# Patient Record
Sex: Female | Born: 1990 | Race: White | Hispanic: No | Marital: Single | Smoking: Never smoker
Health system: Southern US, Community
[De-identification: ages and names within clinical notes are randomized; demographics above are authoritative.]

## PROBLEM LIST (undated history)

## (undated) DIAGNOSIS — I10 Essential (primary) hypertension: Secondary | ICD-10-CM

## (undated) HISTORY — DX: Essential (primary) hypertension: I10

---
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
## 2013-01-28 ENCOUNTER — Ambulatory Visit: Payer: 59 | Admitting: Physician Assistant

## 2013-01-28 VITALS — BP 98/60 | HR 89 | Temp 98.3°F | Resp 18 | Ht 66.0 in | Wt 130.0 lb

## 2013-01-28 DIAGNOSIS — J329 Chronic sinusitis, unspecified: Secondary | ICD-10-CM

## 2013-01-28 DIAGNOSIS — R0981 Nasal congestion: Secondary | ICD-10-CM

## 2013-01-28 DIAGNOSIS — J3489 Other specified disorders of nose and nasal sinuses: Secondary | ICD-10-CM

## 2013-01-28 MED ORDER — AMOXICILLIN 875 MG PO TABS: 875.0000 mg | ORAL_TABLET | Freq: Two times a day (BID) | ORAL | Status: DC

## 2013-01-28 MED ORDER — IPRATROPIUM BROMIDE 0.03 % NA SOLN: 2.0000 | Freq: Two times a day (BID) | NASAL | Status: DC

## 2013-01-28 NOTE — Progress Notes (Signed)
   Subjective:    Patient ID: Maria Frazier, female    DOB: 04-19-1990, 22 y.o.   MRN: [id number]  HPI 22 year old female presents for evaluation of sore throat, nasal congestion, cough, and bilateral ear pressure.  States symptoms started 3 days ago and have progressively worsened.  Has hx of sinus infections in the past.  Denies fever, chills, nausea, vomiting, sinus pain, SOB, wheezing, headache, or dizziness.  She has been taking Mucinex and vitamin C which have not been helping much.   Patient is otherwise healthy with no other concerns today.  She works as a Theatre stage manager at [employer].     Review of Systems  Constitutional: Negative for fever and chills.  HENT: Positive for postnasal drip, rhinorrhea, sinus pressure and sore throat.   Respiratory: Negative for wheezing.   Gastrointestinal: Negative for nausea.  Neurological: Negative for dizziness.       Objective:   Physical Exam  Constitutional: She is oriented to person, place, and time. She appears well-developed and well-nourished.  HENT:  Head: Normocephalic and atraumatic.  Right Ear: Hearing and external ear normal.  Left Ear: Hearing, tympanic membrane, external ear and ear canal normal.  Mouth/Throat: Uvula is midline. Oropharyngeal exudate and posterior oropharyngeal erythema (2+ tonsillar swelling) present.  Right ear cerumen impaction. Normal TM s/p irrigation  Eyes: Conjunctivae are normal.  Neck: Normal range of motion. Neck supple.  Cardiovascular: Normal rate, regular rhythm and normal heart sounds.   Pulmonary/Chest: Effort normal and breath sounds normal.  Lymphadenopathy:    She has no cervical adenopathy.  Neurological: She is alert and oriented to person, place, and time.  Psychiatric: She has a normal mood and affect. Her behavior is normal. Judgment and thought content normal.     Patient reports improvement in ear fullness after irrigation.      Assessment & Plan:  Sinusitis - Plan: amoxicillin (AMOXIL) 875  MG tablet  Nasal congestion - Plan: ipratropium (ATROVENT) 0.03 % nasal spray  Will go ahead and treat with amoxicillin 875 mg bid x 10 days Atrovent NS twice daily to help with sinus pressure and PND Continue Mucinex as directed Increase fluids and rest Note for work for yesterday and tomorrow Follow up if symptoms worsen or fail to improve.

## 2018-08-27 DIAGNOSIS — Z20828 Contact with and (suspected) exposure to other viral communicable diseases: Secondary | ICD-10-CM | POA: Diagnosis not present

## 2019-04-23 ENCOUNTER — Other Ambulatory Visit: Payer: Self-pay

## 2019-04-26 ENCOUNTER — Encounter: Payer: Self-pay | Admitting: Obstetrics and Gynecology

## 2019-04-26 ENCOUNTER — Ambulatory Visit (INDEPENDENT_AMBULATORY_CARE_PROVIDER_SITE_OTHER): Payer: BC Managed Care – PPO | Admitting: Obstetrics and Gynecology

## 2019-04-26 ENCOUNTER — Other Ambulatory Visit: Payer: Self-pay

## 2019-04-26 ENCOUNTER — Other Ambulatory Visit (HOSPITAL_COMMUNITY)
Admission: RE | Admit: 2019-04-26 | Discharge: 2019-04-26 | Disposition: A | Discharge status: Home / Self Care | Payer: BC Managed Care – PPO | Source: Ambulatory Visit | Attending: Obstetrics and Gynecology | Admitting: Obstetrics and Gynecology

## 2019-04-26 VITALS — BP 122/60 | HR 81 | Temp 98.7°F | Ht 64.75 in | Wt 164.0 lb

## 2019-04-26 DIAGNOSIS — Z113 Encounter for screening for infections with a predominantly sexual mode of transmission: Secondary | ICD-10-CM

## 2019-04-26 DIAGNOSIS — Z124 Encounter for screening for malignant neoplasm of cervix: Secondary | ICD-10-CM | POA: Insufficient documentation

## 2019-04-26 DIAGNOSIS — Z01419 Encounter for gynecological examination (general) (routine) without abnormal findings: Secondary | ICD-10-CM | POA: Diagnosis not present

## 2019-04-26 DIAGNOSIS — Z0000 Encounter for general adult medical examination without abnormal findings: Secondary | ICD-10-CM | POA: Diagnosis not present

## 2019-04-26 DIAGNOSIS — Z3009 Encounter for other general counseling and advice on contraception: Secondary | ICD-10-CM

## 2019-04-26 DIAGNOSIS — N946 Dysmenorrhea, unspecified: Secondary | ICD-10-CM | POA: Diagnosis not present

## 2019-04-26 MED ORDER — NORETHIN ACE-ETH ESTRAD-FE 1-20 MG-MCG PO TABS: 1.0000 | ORAL_TABLET | Freq: Every day | ORAL | 0 refills | Status: DC

## 2019-04-26 NOTE — Progress Notes (Signed)
29 y.o. G0P0000 Single White or Caucasian Not Hispanic or Latino female here for annual exam and std testing. She does not have any symptoms.  Period Cycle (Days): 30 Period Duration (Days): 4 Period Pattern: Regular Menstrual Flow: Moderate, Light Menstrual Control: Panty liner, Tampon Menstrual Control Change Freq (Hours): 2 Dysmenorrhea: (!) Severe(bad cramps on the first day and Nausea on the first day.) Dysmenorrhea Symptoms: Cramping, Nausea  She uses regular tampons, could wear a tampon for 4-5 hours, changes them when she goes to the bathroom. Cramps have been severe since her mid teens. Not worsening. Only bad for one day. Midol helps, doesn't miss work, tolerable.  Sexually active, same partner x 2 years. Using W/D for contraception and rhythm.   Patient's last menstrual period was 04/17/2019.          Sexually active: Yes.    The current method of family planning is none.    Exercising: Yes.    Walking Smoker:  no  Health Maintenance: Pap:  Never  History of abnormal Pap:  NA TDaP:  2012 Gardasil: never, information given.    reports that she has never smoked. She has never used smokeless tobacco. She reports current alcohol use. She reports that she does not use drugs. Social drinker, up to 4 drinks a week. 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   Past Medical History:  Diagnosis Date  . Hypertension    white coat     History reviewed. No pertinent surgical history.  No current outpatient medications on file.   No current facility-administered medications for this visit.    Family History  Problem Relation Age of Onset  . Thyroid disease Mother   . Asthma Father   . Hyperlipidemia Father   . Stroke Father     Review of Systems  Constitutional: Negative.   HENT: Negative.   Eyes: Negative.   Respiratory: Negative.   Cardiovascular: Negative.   Gastrointestinal: Negative.   Endocrine: Negative.   Genitourinary: Negative.    Musculoskeletal: Negative.   Skin: Negative.   Allergic/Immunologic: Negative.   Neurological: Negative.   Hematological: Negative.   Psychiatric/Behavioral: Negative.   All other systems reviewed and are negative.   Exam:   BP 122/60   Pulse 81   Temp 98.7 F (37.1 C)   Ht 5' 4.75" (1.645 m)   Wt 164 lb (74.4 kg)   LMP 04/17/2019   SpO2 98%   BMI 27.50 kg/m   Weight change: @WEIGHTCHANGE @ Height:   Height: 5' 4.75" (164.5 cm)  Ht Readings from Last 3 Encounters:  04/26/19 5' 4.75" (1.645 m)  01/28/13 5\' 6"  (1.676 m)    General appearance: alert, cooperative and appears stated age Head: Normocephalic, without obvious abnormality, atraumatic Neck: no adenopathy, supple, symmetrical, trachea midline and thyroid normal to inspection and palpation Lungs: clear to auscultation bilaterally Cardiovascular: regular rate and rhythm Breasts: normal appearance, no masses or tenderness Abdomen: soft, non-tender; non distended,  no masses,  no organomegaly Extremities: extremities normal, atraumatic, no cyanosis or edema Skin: Skin color, texture, turgor normal. No rashes or lesions Lymph nodes: Cervical, supraclavicular, and axillary nodes normal. No abnormal inguinal nodes palpated Neurologic: Grossly normal   Pelvic: External genitalia:  no lesions              Urethra:  normal appearing urethra with no masses, tenderness or lesions              Bartholins and Skenes: normal  Vagina: normal appearing vagina with normal color and discharge, no lesions              Cervix: no lesions               Bimanual Exam:  Uterus:  normal size, contour, position, consistency, mobility, non-tender              Adnexa: no mass, fullness, tenderness               Rectovaginal: declines  Gae Dry chaperoned for the exam.  A:  Well Woman with normal exam  Contraception counseling     P:   Pap with reflex hpv, STD testing  Screening labs/std testing  Discussed breast  self exam  Discussed calcium and vit D intake  Start OCP's, no contraindications, risks reviewed  Pill check in 3 month

## 2019-04-26 NOTE — Patient Instructions (Addendum)
EXERCISE AND DIET:  We recommended that you start or continue a regular exercise program for good health. Regular exercise means any activity that makes your heart beat faster and makes you sweat.  We recommend exercising at least 30 minutes per day at least 3 days a week, preferably 4 or 5.  We also recommend a diet low in fat and sugar.  Inactivity, poor dietary choices and obesity can cause diabetes, heart attack, stroke, and kidney damage, among others.    ALCOHOL AND SMOKING:  Women should limit their alcohol intake to no more than 7 drinks/beers/glasses of wine (combined, not each!) per week. Moderation of alcohol intake to this level decreases your risk of breast cancer and liver damage. And of course, no recreational drugs are part of a healthy lifestyle.  And absolutely no smoking or even second hand smoke. Most people know smoking can cause heart and lung diseases, but did you know it also contributes to weakening of your bones? Aging of your skin?  Yellowing of your teeth and nails?  CALCIUM AND VITAMIN D:  Adequate intake of calcium and Vitamin D are recommended.  The recommendations for exact amounts of these supplements seem to change often, but generally speaking 1,000 mg of calcium (between diet and supplement) and 800 units of Vitamin D per day seems prudent. Certain women may benefit from higher intake of Vitamin D.  If you are among these women, your doctor will have told you during your visit.    PAP SMEARS:  Pap smears, to check for cervical cancer or precancers,  have traditionally been done yearly, although recent scientific advances have shown that most women can have pap smears less often.  However, every woman still should have a physical exam from her gynecologist every year. It will include a breast check, inspection of the vulva and vagina to check for abnormal growths or skin changes, a visual exam of the cervix, and then an exam to evaluate the size and shape of the uterus and  ovaries.  And after 27 years of age, a rectal exam is indicated to check for rectal cancers. We will also provide age appropriate advice regarding health maintenance, like when you should have certain vaccines, screening for sexually transmitted diseases, bone density testing, colonoscopy, mammograms, etc.   MAMMOGRAMS:  All women over 40 years old should have a yearly mammogram. Many facilities now offer a "3D" mammogram, which may cost around $50 extra out of pocket. If possible,  we recommend you accept the option to have the 3D mammogram performed.  It both reduces the number of women who will be called back for extra views which then turn out to be normal, and it is better than the routine mammogram at detecting truly abnormal areas.    COLON CANCER SCREENING: Now recommend starting at age 45. At this time colonoscopy is not covered for routine screening until 50. There are take home tests that can be done between 45-49.   COLONOSCOPY:  Colonoscopy to screen for colon cancer is recommended for all women at age 50.  We know, you hate the idea of the prep.  We agree, BUT, having colon cancer and not knowing it is worse!!  Colon cancer so often starts as a polyp that can be seen and removed at colonscopy, which can quite literally save your life!  And if your first colonoscopy is normal and you have no family history of colon cancer, most women don't have to have it again for   10 years.  Once every ten years, you can do something that may end up saving your life, right?  We will be happy to help you get it scheduled when you are ready.  Be sure to check your insurance coverage so you understand how much it will cost.  It may be covered as a preventative service at no cost, but you should check your particular policy.      Breast Self-Awareness Breast self-awareness means being familiar with how your breasts look and feel. It involves checking your breasts regularly and reporting any changes to your  health care provider. Practicing breast self-awareness is important. A change in your breasts can be a sign of a serious medical problem. Being familiar with how your breasts look and feel allows you to find any problems early, when treatment is more likely to be successful. All women should practice breast self-awareness, including women who have had breast implants. How to do a breast self-exam One way to learn what is normal for your breasts and whether your breasts are changing is to do a breast self-exam. To do a breast self-exam: Look for Changes  1. Remove all the clothing above your waist. 2. Stand in front of a mirror in a room with good lighting. 3. Put your hands on your hips. 4. Push your hands firmly downward. 5. Compare your breasts in the mirror. Look for differences between them (asymmetry), such as: ? Differences in shape. ? Differences in size. ? Puckers, dips, and bumps in one breast and not the other. 6. Look at each breast for changes in your skin, such as: ? Redness. ? Scaly areas. 7. Look for changes in your nipples, such as: ? Discharge. ? Bleeding. ? Dimpling. ? Redness. ? A change in position. Feel for Changes Carefully feel your breasts for lumps and changes. It is best to do this while lying on your back on the floor and again while sitting or standing in the shower or tub with soapy water on your skin. Feel each breast in the following way:  Place the arm on the side of the breast you are examining above your head.  Feel your breast with the other hand.  Start in the nipple area and make  inch (2 cm) overlapping circles to feel your breast. Use the pads of your three middle fingers to do this. Apply light pressure, then medium pressure, then firm pressure. The light pressure will allow you to feel the tissue closest to the skin. The medium pressure will allow you to feel the tissue that is a little deeper. The firm pressure will allow you to feel the tissue  close to the ribs.  Continue the overlapping circles, moving downward over the breast until you feel your ribs below your breast.  Move one finger-width toward the center of the body. Continue to use the  inch (2 cm) overlapping circles to feel your breast as you move slowly up toward your collarbone.  Continue the up and down exam using all three pressures until you reach your armpit.  Write Down What You Find  Write down what is normal for each breast and any changes that you find. Keep a written record with breast changes or normal findings for each breast. By writing this information down, you do not need to depend only on memory for size, tenderness, or location. Write down where you are in your menstrual cycle, if you are still menstruating. If you are having trouble noticing differences   in your breasts, do not get discouraged. With time you will become more familiar with the variations in your breasts and more comfortable with the exam. How often should I examine my breasts? Examine your breasts every month. If you are breastfeeding, the best time to examine your breasts is after a feeding or after using a breast pump. If you menstruate, the best time to examine your breasts is 5-7 days after your period is over. During your period, your breasts are lumpier, and it may be more difficult to notice changes. When should I see my health care provider? See your health care provider if you notice:  A change in shape or size of your breasts or nipples.  A change in the skin of your breast or nipples, such as a reddened or scaly area.  Unusual discharge from your nipples.  A lump or thick area that was not there before.  Pain in your breasts.  Anything that concerns you.  Oral Contraception Information Oral contraceptive pills (OCPs) are medicines taken to prevent pregnancy. OCPs are taken by mouth, and they work by:  Preventing the ovaries from releasing eggs.  Thickening mucus in  the lower part of the uterus (cervix), which prevents sperm from entering the uterus.  Thinning the lining of the uterus (endometrium), which prevents a fertilized egg from attaching to the endometrium. OCPs are highly effective when taken exactly as prescribed. However, OCPs do not prevent STIs (sexually transmitted infections). Safe sex practices, such as using condoms while on an OCP, can help prevent STIs. Before starting OCPs Before you start taking OCPs, you may have a physical exam, blood test, and Pap test. However, you are not required to have a pelvic exam in order to be prescribed OCPs. Your health care provider will make sure you are a good candidate for oral contraception. OCPs are not a good option for certain women, including women who smoke and are older than 35 years, and women with a medical history of high blood pressure, deep vein thrombosis, pulmonary embolism, stroke, cardiovascular disease, or peripheral vascular disease. Discuss with your health care provider the possible side effects of the OCP you may be prescribed. When you start an OCP, be aware that it can take 2-3 months for your body to adjust to changes in hormone levels. Follow instructions from your health care provider about how to start taking your first cycle of OCPs. Depending on when you start the pill, you may need to use a backup form of birth control, such as condoms, during the first week. Make sure you know what steps to take if you ever forget to take the pill. Types of oral contraception  The most common types of birth control pills contain the hormones estrogen and progestin (synthetic progesterone) or progestin only. The combination pill This type of pill contains estrogen and progestin hormones. Combination pills often come in packs of 21, 28, or 91 pills. For each pack, the last 7 pills may not contain hormones, which means you may stop taking the pills for 7 days. Menstrual bleeding occurs during the  week that you do not take the pills or that you take the pills with no hormones in them. The minipill This type of pill contains the progestin hormone only. It comes in packs of 28 pills. All 28 pills contain the hormone. You take the pill every day. It is very important to take the pill at the same time each day. Advantages of oral contraceptive pills    Provides reliable and continuous contraception if taken as instructed.  May treat or decrease symptoms of: ? Menstrual period cramps. ? Irregular menstrual cycle or bleeding. ? Heavy menstrual flow. ? Abnormal uterine bleeding. ? Acne, depending on the type of pill. ? Polycystic ovarian syndrome. ? Endometriosis. ? Iron deficiency anemia. ? Premenstrual symptoms, including premenstrual dysphoric disorder.  May reduce the risk of endometrial and ovarian cancer.  Can be used as emergency contraception.  Prevents mislocated (ectopic) pregnancies and infections of the fallopian tubes. Things that can make oral contraceptive pills less effective OCPs can be less effective if:  You forget to take the pill at the same time every day. This is especially important when taking the minipill.  You have a stomach or intestinal disease that reduces your body's ability to absorb the pill.  You take OCPs with other medicines that make OCPs less effective, such as antibiotics, certain HIV medicines, and some seizure medicines.  You take expired OCPs.  You forget to restart the pill on day 7, if using the packs of 21 pills. Risks associated with oral contraceptive pills Oral contraceptive pills can sometimes cause side effects, such as:  Headache.  Depression.  Trouble sleeping.  Nausea and vomiting.  Breast tenderness.  Irregular bleeding or spotting during the first several months.  Bloating or fluid retention.  Increase in blood pressure. Combination pills are also associated with a small increase in the risk of:  Blood  clots.  Heart attack.  Stroke. Summary  Oral contraceptive pills are medicines taken by mouth to prevent pregnancy. They are highly effective when taken exactly as prescribed.  The most common types of birth control pills contain the hormones estrogen and progestin (synthetic progesterone) or progestin only.  Before you start taking the pill, you may have a physical exam, blood test, and Pap test. Your health care provider will make sure you are a good candidate for oral contraception.  The combination pill may come in a 21-day pack, a 28-day pack, or a 91-day pack. The minipill contains the progesterone hormone only and comes in packs of 28 pills.  Oral contraceptive pills can sometimes cause side effects, such as headache, nausea, breast tenderness, or irregular bleeding. This information is not intended to replace advice given to you by your health care provider. Make sure you discuss any questions you have with your health care provider. Document Revised: 01/10/2017 Document Reviewed: 04/23/2016 Elsevier Patient Education  2020 [employer]  

## 2019-04-27 LAB — HEP, RPR, HIV PANEL
HIV Screen 4th Generation wRfx: NONREACTIVE
Hepatitis B Surface Ag: NEGATIVE
RPR Ser Ql: NONREACTIVE

## 2019-04-27 LAB — COMPREHENSIVE METABOLIC PANEL
ALT: 8 IU/L (ref 0–32)
AST: 15 IU/L (ref 0–40)
Albumin/Globulin Ratio: 2.3 — ABNORMAL HIGH (ref 1.2–2.2)
Albumin: 4.9 g/dL (ref 3.9–5.0)
Alkaline Phosphatase: 78 IU/L (ref 39–117)
BUN/Creatinine Ratio: 14 (ref 9–23)
BUN: 8 mg/dL (ref 6–20)
Bilirubin Total: 0.9 mg/dL (ref 0.0–1.2)
CO2: 21 mmol/L (ref 20–29)
Calcium: 9.2 mg/dL (ref 8.7–10.2)
Chloride: 99 mmol/L (ref 96–106)
Creatinine, Ser: 0.59 mg/dL (ref 0.57–1.00)
GFR calc Af Amer: 145 mL/min/{1.73_m2} (ref >59)
GFR calc non Af Amer: 126 mL/min/{1.73_m2} (ref >59)
Globulin, Total: 2.1 g/dL (ref 1.5–4.5)
Glucose: 94 mg/dL (ref 65–99)
Potassium: 3.4 mmol/L — ABNORMAL LOW (ref 3.5–5.2)
Sodium: 137 mmol/L (ref 134–144)
Total Protein: 7 g/dL (ref 6.0–8.5)

## 2019-04-27 LAB — CBC
Hematocrit: 43.3 % (ref 34.0–46.6)
Hemoglobin: 14.7 g/dL (ref 11.1–15.9)
MCH: 31.2 pg (ref 26.6–33.0)
MCHC: 33.9 g/dL (ref 31.5–35.7)
MCV: 92 fL (ref 79–97)
Platelets: 293 10*3/uL (ref 150–450)
RBC: 4.71 x10E6/uL (ref 3.77–5.28)
RDW: 12.5 % (ref 11.7–15.4)
WBC: 11 10*3/uL — ABNORMAL HIGH (ref 3.4–10.8)

## 2019-04-27 LAB — LIPID PANEL
Chol/HDL Ratio: 3.4 ratio (ref 0.0–4.4)
Cholesterol, Total: 201 mg/dL — ABNORMAL HIGH (ref 100–199)
HDL: 60 mg/dL (ref >39)
LDL Chol Calc (NIH): 123 mg/dL — ABNORMAL HIGH (ref 0–99)
Triglycerides: 101 mg/dL (ref 0–149)
VLDL Cholesterol Cal: 18 mg/dL (ref 5–40)

## 2019-04-27 LAB — HEPATITIS C ANTIBODY: Hep C Virus Ab: <0.1 s/co ratio (ref 0.0–0.9)

## 2019-04-28 LAB — CYTOLOGY - PAP
Chlamydia: NEGATIVE
Comment: NEGATIVE
Comment: NEGATIVE
Comment: NORMAL
Diagnosis: NEGATIVE
Neisseria Gonorrhea: NEGATIVE
Trichomonas: NEGATIVE

## 2019-08-02 ENCOUNTER — Telehealth: Payer: Self-pay

## 2019-08-02 NOTE — Telephone Encounter (Signed)
Patient canceled med check appointment. Patient did not wish to reschedule.

## 2019-08-04 ENCOUNTER — Telehealth: Payer: BC Managed Care – PPO | Admitting: Obstetrics and Gynecology

## 2020-04-27 ENCOUNTER — Ambulatory Visit: Payer: BC Managed Care – PPO | Admitting: Obstetrics and Gynecology

## 2020-07-04 ENCOUNTER — Ambulatory Visit: Payer: BC Managed Care – PPO | Admitting: Obstetrics and Gynecology

## 2020-07-12 ENCOUNTER — Other Ambulatory Visit: Payer: Self-pay

## 2020-07-12 ENCOUNTER — Encounter (HOSPITAL_COMMUNITY): Payer: Self-pay

## 2020-07-12 ENCOUNTER — Ambulatory Visit (HOSPITAL_COMMUNITY)
Admission: EM | Admit: 2020-07-12 | Discharge: 2020-07-12 | Disposition: A | Discharge status: Home / Self Care | Payer: BC Managed Care – PPO | Attending: Family Medicine | Admitting: Family Medicine

## 2020-07-12 DIAGNOSIS — R42 Dizziness and giddiness: Secondary | ICD-10-CM | POA: Diagnosis not present

## 2020-07-12 DIAGNOSIS — R000 Tachycardia, unspecified: Secondary | ICD-10-CM | POA: Insufficient documentation

## 2020-07-12 LAB — BASIC METABOLIC PANEL
Anion gap: 14 (ref 5–15)
BUN: 7 mg/dL (ref 6–20)
CO2: 22 mmol/L (ref 22–32)
Calcium: 9.4 mg/dL (ref 8.9–10.3)
Chloride: 101 mmol/L (ref 98–111)
Creatinine, Ser: 0.81 mg/dL (ref 0.44–1.00)
GFR, Estimated: >60 mL/min (ref >60)
Glucose, Bld: 119 mg/dL — ABNORMAL HIGH (ref 70–99)
Potassium: 3.6 mmol/L (ref 3.5–5.1)
Sodium: 137 mmol/L (ref 135–145)

## 2020-07-12 LAB — POCT URINALYSIS DIPSTICK, ED / UC
Bilirubin Urine: NEGATIVE
Glucose, UA: NEGATIVE mg/dL
Ketones, ur: NEGATIVE mg/dL
Leukocytes,Ua: NEGATIVE
Nitrite: NEGATIVE
Protein, ur: NEGATIVE mg/dL
Specific Gravity, Urine: <=1.005 (ref 1.005–1.030)
Urobilinogen, UA: 0.2 mg/dL (ref 0.0–1.0)
pH: 5.5 (ref 5.0–8.0)

## 2020-07-12 LAB — CBC
HCT: 40.4 % (ref 36.0–46.0)
Hemoglobin: 14 g/dL (ref 12.0–15.0)
MCH: 30 pg (ref 26.0–34.0)
MCHC: 34.7 g/dL (ref 30.0–36.0)
MCV: 86.7 fL (ref 80.0–100.0)
Platelets: 303 10*3/uL (ref 150–400)
RBC: 4.66 MIL/uL (ref 3.87–5.11)
RDW: 12.6 % (ref 11.5–15.5)
WBC: 12.7 10*3/uL — ABNORMAL HIGH (ref 4.0–10.5)
nRBC: 0 % (ref 0.0–0.2)

## 2020-07-12 LAB — CBG MONITORING, ED: Glucose-Capillary: 119 mg/dL — ABNORMAL HIGH (ref 70–99)

## 2020-07-12 LAB — POC URINE PREG, ED: Preg Test, Ur: NEGATIVE

## 2020-07-12 LAB — TSH: TSH: 1.596 u[IU]/mL (ref 0.350–4.500)

## 2020-07-12 NOTE — Discharge Instructions (Addendum)
You have had labs (blood work) drawn today. We will call you with any significant abnormalities or if there is need to begin or change treatment or pursue further follow up.  You may also review your test results online through MyChart. If you do not have a MyChart account, instructions to sign up should be on your discharge paperwork.  

## 2020-07-12 NOTE — ED Triage Notes (Signed)
Pt c/o feeling lightheaded for a few days and states she has been feeling as if she would pass out. Pt states she thought she was dehydrated and drank water. She states she still feels lightheaded.

## 2020-07-12 NOTE — ED Provider Notes (Signed)
[facility]   [id number] 07/12/20 Arrival Time: 1432  ASSESSMENT & PLAN:  1. Lightheadedness     ECG: Performed today and interpreted by me: NSR; no arrhythmia.   Unclear etiology. CBG 119. Will f/u with PCP to recheck. U/A without signs of infection.  Pending:  CBC  BASIC METABOLIC PANEL  TSH   She is comfortable with home observation. Encourage hydration.  Reviewed expectations re: course of current medical issues. Questions answered. Outlined signs and symptoms indicating need for more acute intervention. Patient verbalized understanding. After Visit Summary given.   SUBJECTIVE:  History from: patient. Maria Frazier is a 30 y.o. female who presents with complaint of episodic lightheadedness; first noted few days ago. No syncope "but feel like I might pass out". Afebrile. No recent illnesses. Normal PO intake. Vomited once at onset of symptoms; none since. No current nausea. No associated CP or SOB. No specific aggravating or alleviating factors reported. Normal sleep habits. Denies: irregular heart beat, lower extremity edema, orthopnea and paroxysmal nocturnal dyspnea. Ambulatory without assistance. Self/OTC treatment: none PTA. History of similar: no. Street drug use: denies.  Social History   Tobacco Use  Smoking Status Never Smoker  Smokeless Tobacco Never Used   Social History   Substance and Sexual Activity  Alcohol Use Yes   Comment: socal     OBJECTIVE:  Vitals:   07/12/20 1446  BP: 121/75  Pulse: (!) 111  Resp: 20  Temp: 98.9 F (37.2 C)  TempSrc: Oral  SpO2: 100%    Tachycardia noted. Reports very nervious when seeing doctors.  General appearance: alert, oriented, no acute distress Eyes: PERRLA; EOMI; conjunctivae normal HENT: normocephalic; atraumatic Neck: supple with FROM Lungs: without labored respirations; speaks full sentences without difficulty; CTAB Heart: regular rate and rhythm without murmer Chest Wall: without  tenderness to palpation Abdomen: soft, non-tender; no guarding or rebound tenderness Extremities: without edema; without calf swelling or tenderness; symmetrical without gross deformities Skin: warm and dry; without rash or lesions Neuro: normal gait Psychological: alert and cooperative; normal mood and affect  Labs: Results for orders placed or performed during the hospital encounter of 07/12/20  POC urine pregnancy  Result Value Ref Range   Preg Test, Ur NEGATIVE NEGATIVE  POC Urinalysis dipstick  Result Value Ref Range   Glucose, UA NEGATIVE NEGATIVE mg/dL   Bilirubin Urine NEGATIVE NEGATIVE   Ketones, ur NEGATIVE NEGATIVE mg/dL   Specific Gravity, Urine <=1.005 1.005 - 1.030   Hgb urine dipstick MODERATE (A) NEGATIVE   pH 5.5 5.0 - 8.0   Protein, ur NEGATIVE NEGATIVE mg/dL   Urobilinogen, UA 0.2 0.0 - 1.0 mg/dL   Nitrite NEGATIVE NEGATIVE   Leukocytes,Ua NEGATIVE NEGATIVE  POC CBG monitoring  Result Value Ref Range   Glucose-Capillary 119 (H) 70 - 99 mg/dL   Labs Reviewed  POCT URINALYSIS DIPSTICK, ED / UC - Abnormal; Notable for the following components:      Result Value   Hgb urine dipstick MODERATE (*)    All other components within normal limits  CBG MONITORING, ED - Abnormal; Notable for the following components:   Glucose-Capillary 119 (*)    All other components within normal limits  CBC  BASIC METABOLIC PANEL  TSH  POC URINE PREG, ED     No Known Allergies  Past Medical History:  Diagnosis Date  . Hypertension    white coat    Social History   Socioeconomic History  . Marital status: Single    Spouse name:  Not on file  . Number of children: Not on file  . Years of education: Not on file  . Highest education level: Not on file  Occupational History  . Not on file  Tobacco Use  . Smoking status: Never Smoker  . Smokeless tobacco: Never Used  Vaping Use  . Vaping Use: Never used  Substance and Sexual Activity  . Alcohol use: Yes    Comment:  socal   . Drug use: Never  . Sexual activity: Yes    Birth control/protection: None  Other Topics Concern  . Not on file  Social History Narrative  . Not on file   Social Determinants of Health   Financial Resource Strain: Not on file  Food Insecurity: Not on file  Transportation Needs: Not on file  Physical Activity: Not on file  Stress: Not on file  Social Connections: Not on file  Intimate Partner Violence: Not on file   Family History  Problem Relation Age of Onset  . Thyroid disease Mother   . Asthma Father   . Hyperlipidemia Father   . Stroke Father    History reviewed. No pertinent surgical history.   Mardella Layman, MD 07/12/20 1626

## 2020-07-24 DIAGNOSIS — H66001 Acute suppurative otitis media without spontaneous rupture of ear drum, right ear: Secondary | ICD-10-CM | POA: Diagnosis not present

## 2020-07-28 DIAGNOSIS — F419 Anxiety disorder, unspecified: Secondary | ICD-10-CM | POA: Diagnosis not present

## 2020-08-16 DIAGNOSIS — R55 Syncope and collapse: Secondary | ICD-10-CM | POA: Diagnosis not present

## 2020-08-16 DIAGNOSIS — E559 Vitamin D deficiency, unspecified: Secondary | ICD-10-CM | POA: Diagnosis not present

## 2020-08-16 DIAGNOSIS — G909 Disorder of the autonomic nervous system, unspecified: Secondary | ICD-10-CM | POA: Diagnosis not present

## 2020-09-05 DIAGNOSIS — E559 Vitamin D deficiency, unspecified: Secondary | ICD-10-CM | POA: Diagnosis not present

## 2020-09-05 DIAGNOSIS — R5383 Other fatigue: Secondary | ICD-10-CM | POA: Diagnosis not present

## 2020-09-05 DIAGNOSIS — G909 Disorder of the autonomic nervous system, unspecified: Secondary | ICD-10-CM | POA: Diagnosis not present

## 2020-09-05 DIAGNOSIS — Z0001 Encounter for general adult medical examination with abnormal findings: Secondary | ICD-10-CM | POA: Diagnosis not present

## 2020-09-05 DIAGNOSIS — M2550 Pain in unspecified joint: Secondary | ICD-10-CM | POA: Diagnosis not present

## 2020-11-14 ENCOUNTER — Ambulatory Visit: Payer: BC Managed Care – PPO | Admitting: Obstetrics and Gynecology

## 2021-01-29 NOTE — Progress Notes (Signed)
30 y.o. G0P0000 Single White or Caucasian Not Hispanic or Latino female here for annual exam.   Period Cycle (Days): 28 Period Duration (Days): 4-5 Period Pattern: Regular Menstrual Flow: Moderate Menstrual Control: Panty liner, Tampon Menstrual Control Change Freq (Hours): 4-6 Dysmenorrhea: (!) Severe Dysmenorrhea Symptoms: Cramping, Headache, Diarrhea, Nausea Cramps are the same, bad for 1 day.  Sexually active, no current partner. Uses condoms. Declines STD testing.   Patient's last menstrual period was 02/01/2021.          Sexually active: Yes.    The current method of family planning is condoms most of the time.    Exercising: No.  The patient does not participate in regular exercise at present. Smoker:  no  Health Maintenance: Pap:  04/26/19 Normal  History of abnormal Pap:  no MMG:  n/a BMD:   n/a Colonoscopy: n/a TDaP:  2012 Gardasil: never info was given last year    reports that she has never smoked. She has never used smokeless tobacco. She reports current alcohol use. She reports that she does not use drugs. Rare ETOH. 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  Past Medical History:  Diagnosis Date   Hypertension    white coat     No past surgical history on file.  No current outpatient medications on file.   No current facility-administered medications for this visit.    Family History  Problem Relation Age of Onset   Thyroid disease Mother    Asthma Father    Hyperlipidemia Father    Stroke Father     Review of Systems  All other systems reviewed and are negative.  Exam:   BP 110/72    Pulse 88    Ht 5\' 6"  (1.676 m)    Wt 171 lb (77.6 kg)    LMP 02/01/2021    SpO2 98%    BMI 27.60 kg/m   Weight change: @WEIGHTCHANGE @ Height:   Height: 5\' 6"  (167.6 cm)  Ht Readings from Last 3 Encounters:  02/07/21 5\' 6"  (1.676 m)  04/26/19 5' 4.75" (1.645 m)  01/28/13 5\' 6"  (1.676 m)    General appearance: alert, cooperative and  appears stated age Head: Normocephalic, without obvious abnormality, atraumatic Neck: no adenopathy, supple, symmetrical, trachea midline and thyroid normal to inspection and palpation Lungs: clear to auscultation bilaterally Cardiovascular: regular rate and rhythm Breasts: normal appearance, no masses or tenderness Abdomen: soft, non-tender; non distended,  no masses,  no organomegaly Extremities: extremities normal, atraumatic, no cyanosis or edema Skin: Skin color, texture, turgor normal. No rashes or lesions Lymph nodes: Cervical, supraclavicular, and axillary nodes normal. No abnormal inguinal nodes palpated Neurologic: Grossly normal   Pelvic: External genitalia:  no lesions              Urethra:  normal appearing urethra with no masses, tenderness or lesions              Bartholins and Skenes: normal                 Vagina: normal appearing vagina with normal color and discharge, no lesions              Cervix: no lesions               Bimanual Exam:  Uterus:   no masses or tenderness              Adnexa: no mass, fullness, tenderness  Rectovaginal: deferred  Carolynn Serve chaperoned for the exam.  1. Well woman exam Discussed breast self exam Discussed calcium and vit D intake Labs with primary No pap this year Condoms for contraception  2. Severe dysmenorrhea - ibuprofen (ADVIL) 800 MG tablet; Take 1 tablet (800 mg total) by mouth every 8 (eight) hours as needed.  Dispense: 30 tablet; Refill: 2

## 2021-02-07 ENCOUNTER — Other Ambulatory Visit: Payer: Self-pay

## 2021-02-07 ENCOUNTER — Encounter: Payer: Self-pay | Admitting: Obstetrics and Gynecology

## 2021-02-07 ENCOUNTER — Ambulatory Visit (INDEPENDENT_AMBULATORY_CARE_PROVIDER_SITE_OTHER): Payer: BC Managed Care – PPO | Admitting: Obstetrics and Gynecology

## 2021-02-07 VITALS — BP 110/72 | HR 88 | Ht 66.0 in | Wt 171.0 lb

## 2021-02-07 DIAGNOSIS — Z01419 Encounter for gynecological examination (general) (routine) without abnormal findings: Secondary | ICD-10-CM | POA: Diagnosis not present

## 2021-02-07 DIAGNOSIS — N946 Dysmenorrhea, unspecified: Secondary | ICD-10-CM

## 2021-02-07 MED ORDER — IBUPROFEN 800 MG PO TABS: 800.0000 mg | ORAL_TABLET | Freq: Three times a day (TID) | ORAL | 2 refills | Status: AC | PRN

## 2021-02-07 NOTE — Patient Instructions (Signed)

## 2021-03-13 DIAGNOSIS — H1012 Acute atopic conjunctivitis, left eye: Secondary | ICD-10-CM | POA: Diagnosis not present

## 2021-03-13 DIAGNOSIS — J069 Acute upper respiratory infection, unspecified: Secondary | ICD-10-CM | POA: Diagnosis not present

## 2021-11-13 DIAGNOSIS — H60312 Diffuse otitis externa, left ear: Secondary | ICD-10-CM | POA: Diagnosis not present
